# Patient Record
Sex: Male | Born: 1955 | Race: White | Hispanic: No | Smoking: Former smoker
Health system: Southern US, Community
[De-identification: ages and names within clinical notes are randomized; demographics above are authoritative.]

## PROBLEM LIST (undated history)

## (undated) DIAGNOSIS — E119 Type 2 diabetes mellitus without complications: Secondary | ICD-10-CM

## (undated) DIAGNOSIS — I10 Essential (primary) hypertension: Secondary | ICD-10-CM

## (undated) DIAGNOSIS — E785 Hyperlipidemia, unspecified: Secondary | ICD-10-CM

## (undated) DIAGNOSIS — E039 Hypothyroidism, unspecified: Secondary | ICD-10-CM

## (undated) HISTORY — DX: Hypothyroidism, unspecified: E03.9

## (undated) HISTORY — DX: Hyperlipidemia, unspecified: E78.5

## (undated) HISTORY — DX: Type 2 diabetes mellitus without complications: E11.9

## (undated) HISTORY — DX: Essential (primary) hypertension: I10

---

## 2004-08-01 ENCOUNTER — Ambulatory Visit (HOSPITAL_COMMUNITY): Admission: RE | Admit: 2004-08-01 | Discharge: 2004-08-01 | Payer: Self-pay | Admitting: Gastroenterology

## 2005-10-14 ENCOUNTER — Ambulatory Visit: Payer: Self-pay | Admitting: Urology

## 2006-06-24 ENCOUNTER — Emergency Department: Payer: Self-pay | Admitting: Emergency Medicine

## 2006-07-14 ENCOUNTER — Ambulatory Visit: Payer: Self-pay | Admitting: Family Medicine

## 2010-02-05 ENCOUNTER — Ambulatory Visit: Payer: Self-pay | Admitting: Family Medicine

## 2011-06-22 ENCOUNTER — Other Ambulatory Visit: Payer: Self-pay | Admitting: Gastroenterology

## 2013-03-16 ENCOUNTER — Ambulatory Visit: Payer: Self-pay | Admitting: Unknown Physician Specialty

## 2014-01-10 IMAGING — US ABDOMEN ULTRASOUND LIMITED
1 series · 13 of 25 positions shown · non-contrast
Comparison: none

REASON FOR EXAM: gallbladder pancreas RUQ pain
COMMENTS:

[Series 1: abdomen ultrasound limited · 0.28mm/px · 13 of 58 slices shown]
[im 1/58]
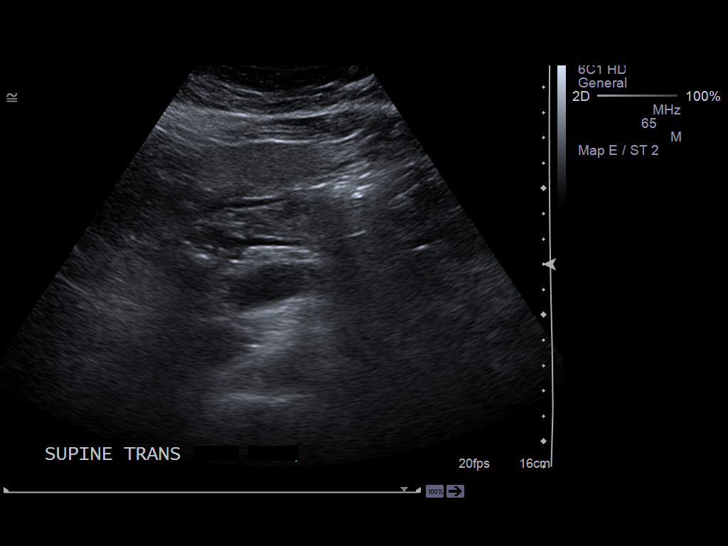
[im 5/58]
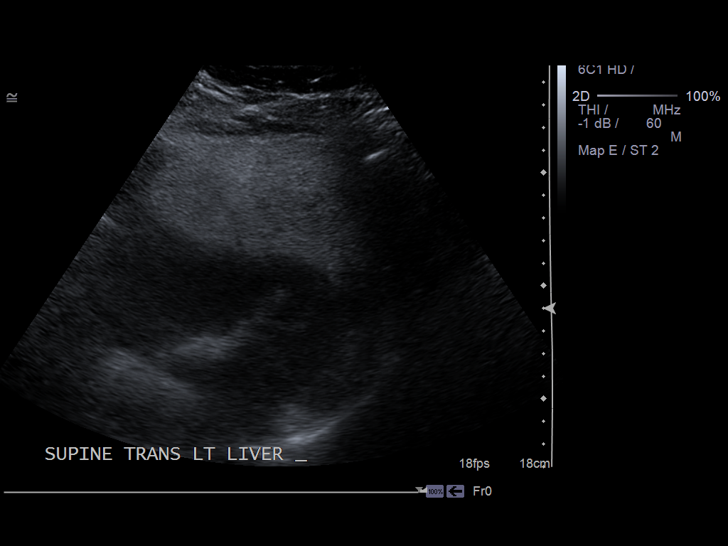
[im 10/58]
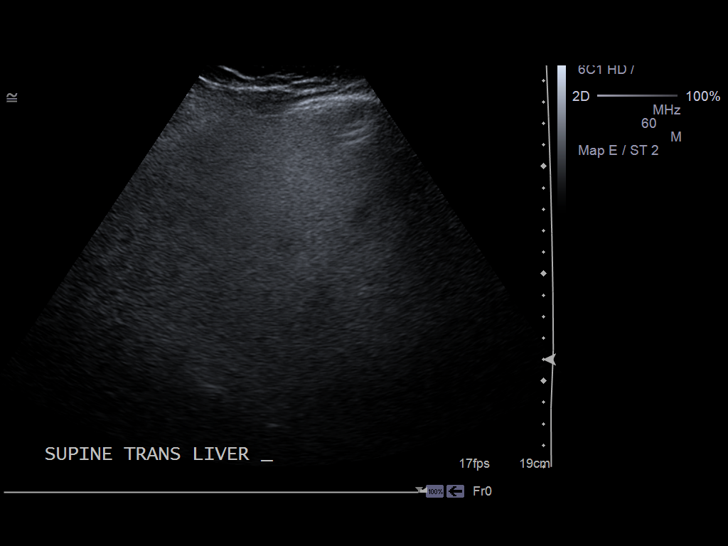
[im 15/58]
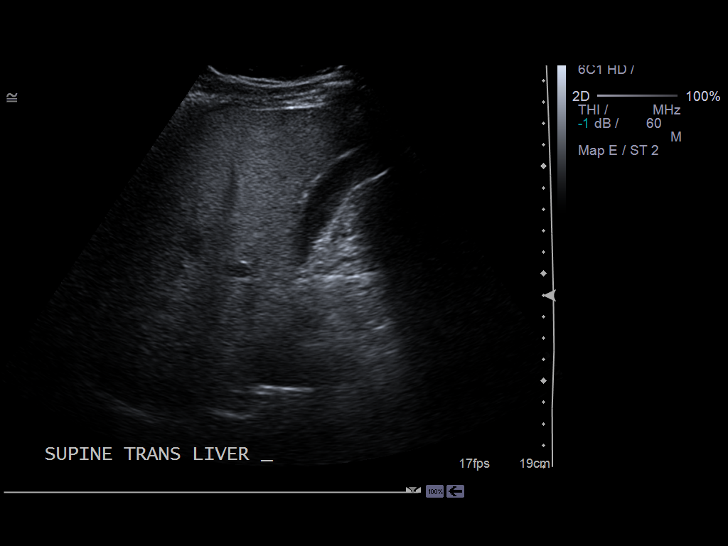
[im 20/58]
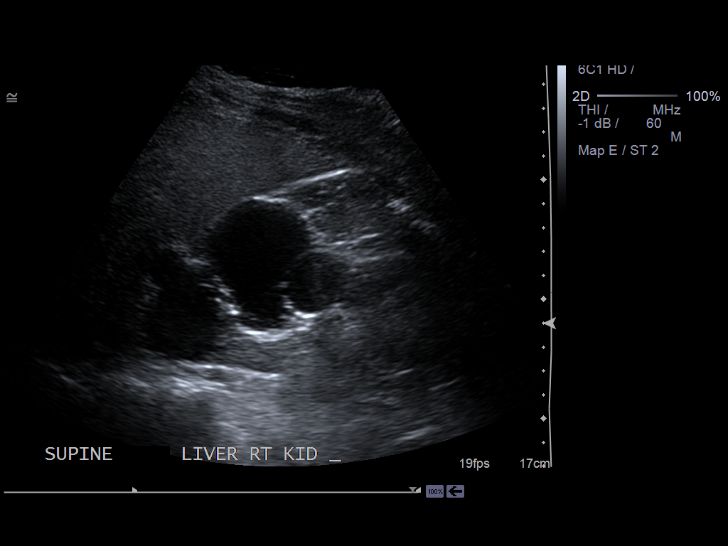
[im 24/58]
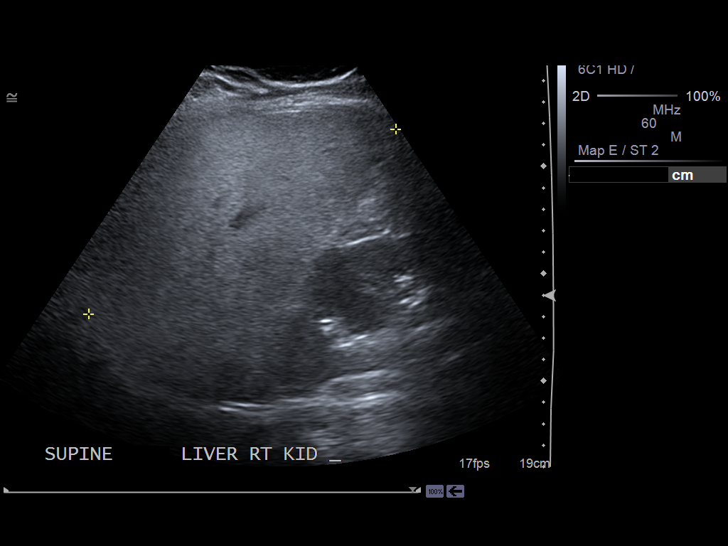
[im 29/58]
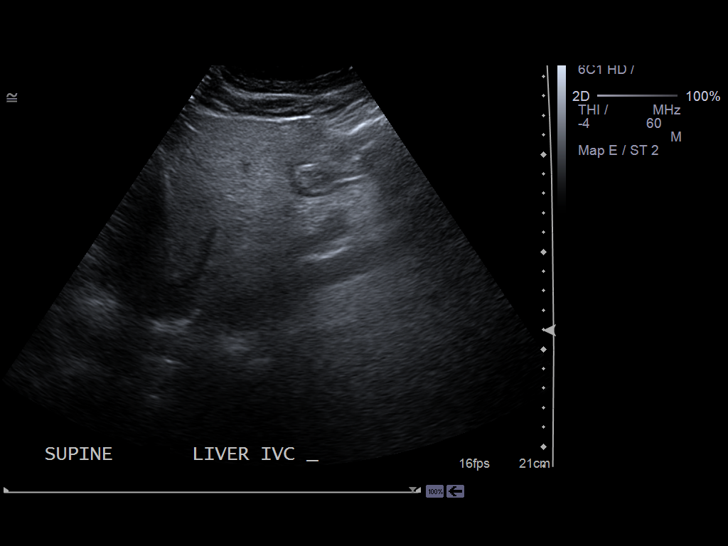
[im 34/58]
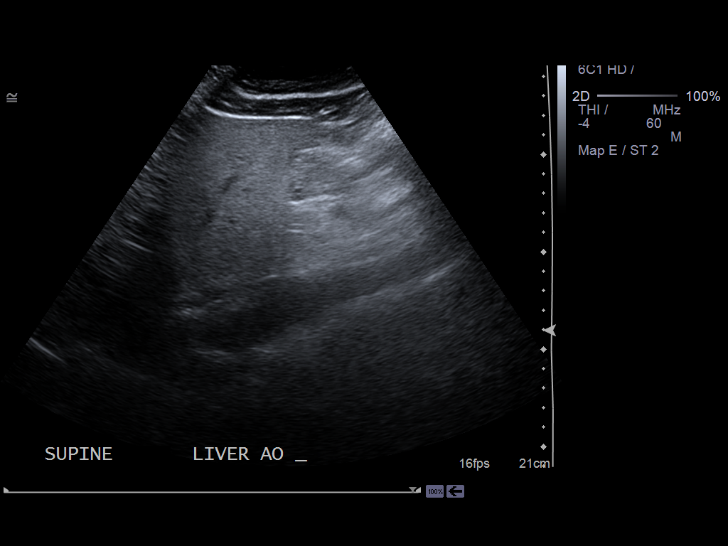
[im 39/58]
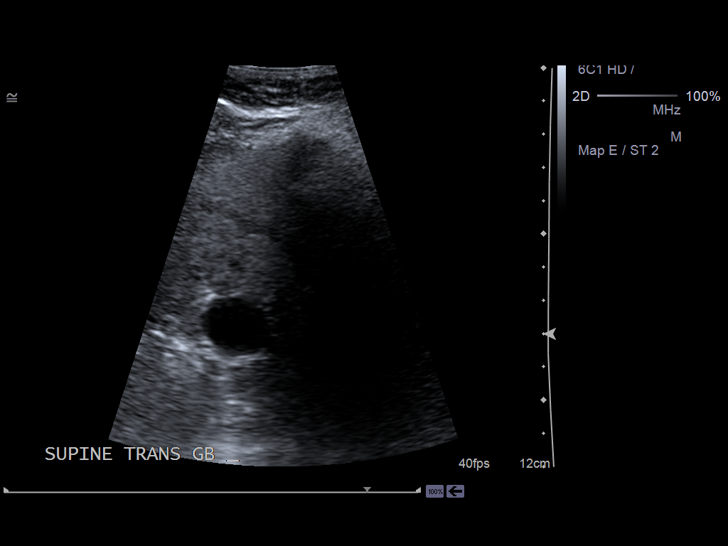
[im 43/58]
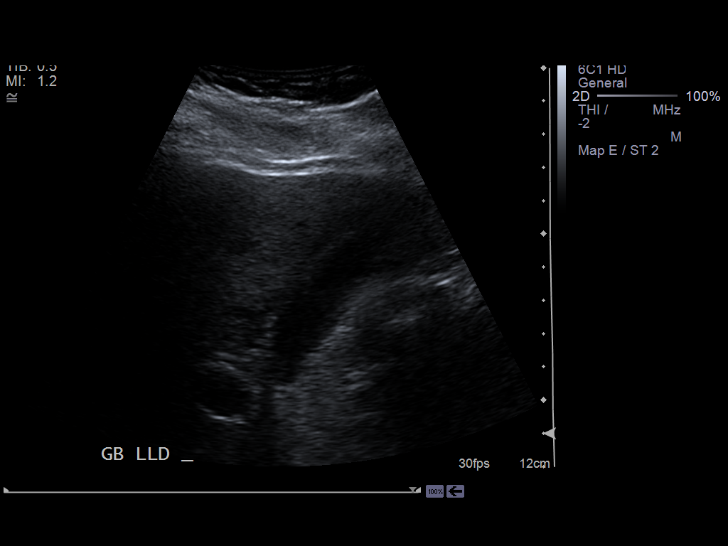
[im 48/58]
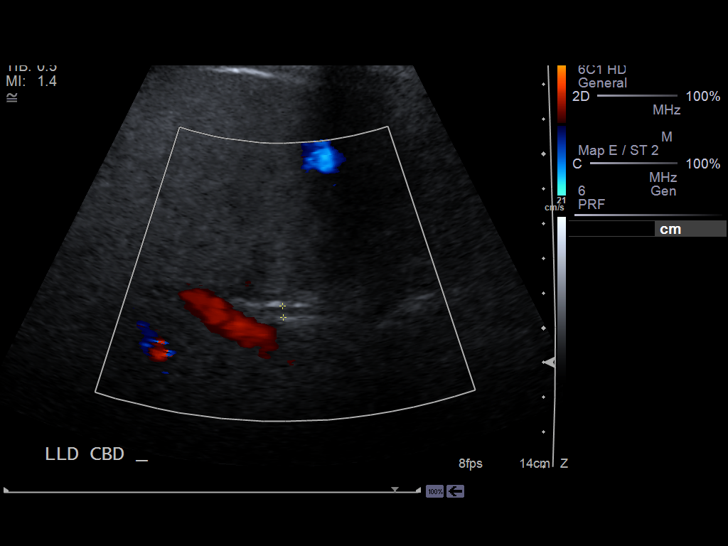
[im 53/58]
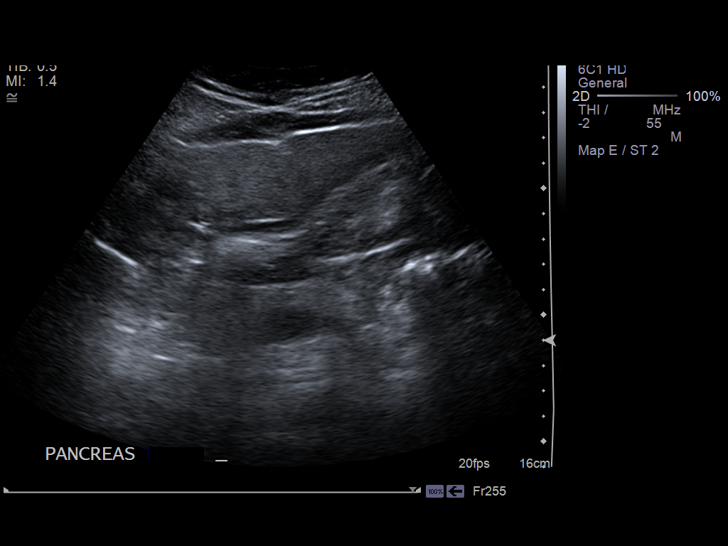
[im 58/58]
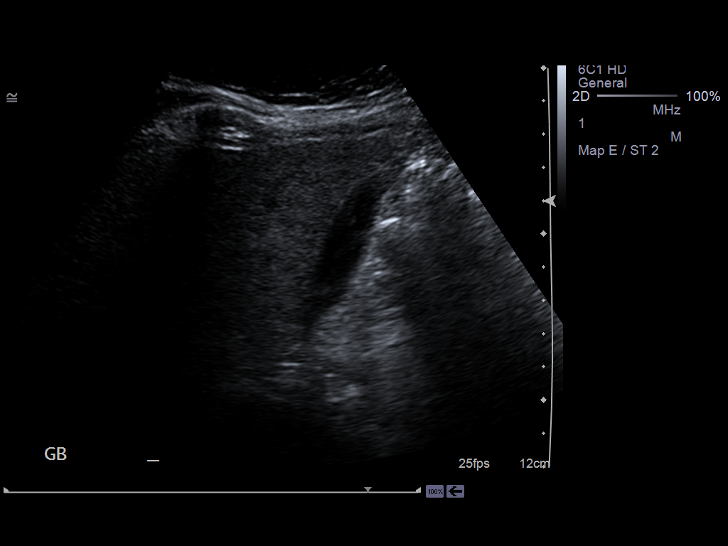

[13 of 25 positions shown; findings below may reference images not displayed]

PROCEDURE:     US  - US ABDOMEN LIMITED SURVEY  - March 16, 2013  [DATE]

RESULT:     A limited upper abdominal ultrasound examination was performed.

The liver exhibits increased echotexture consistent with fatty infiltration.
There is no focal mass or ductal dilation. Portal venous flow is normal in
direction toward the liver. Bowel gas limits evaluation of the pancreas in
that only the body could be demonstrated. No abnormality of the pancreatic
body is demonstrated.

The gallbladder is partially distended. There is no evidence of stones or
sludge. There is no positive sonographic Murphy's sign. There is no
gallbladder wall thickening or pericholecystic fluid. The common bile duct
measures 3.3 mm in diameter.

Incidental note is made of multiple septated cyst in the right kidney with
the largest measuring 6. 6 cm in greatest dimension.
IMPRESSION: 1. There are fatty infiltrative changes of the liver.
2. The gallbladder and common bile duct are normal in appearance.
3. The visualized portions of the pancreas appear normal.
4. There are multiple cysts associated with the right kidney.

[REDACTED]

## 2021-10-17 ENCOUNTER — Ambulatory Visit (INDEPENDENT_AMBULATORY_CARE_PROVIDER_SITE_OTHER): Payer: Self-pay | Admitting: Urology

## 2021-10-17 ENCOUNTER — Encounter: Payer: Self-pay | Admitting: Urology

## 2021-10-17 ENCOUNTER — Other Ambulatory Visit: Payer: Self-pay

## 2021-10-17 VITALS — BP 163/86 | HR 80 | Ht 71.0 in | Wt 185.0 lb

## 2021-10-17 DIAGNOSIS — N281 Cyst of kidney, acquired: Secondary | ICD-10-CM

## 2021-10-17 DIAGNOSIS — N2889 Other specified disorders of kidney and ureter: Secondary | ICD-10-CM

## 2021-10-17 LAB — URINALYSIS, COMPLETE
Bilirubin, UA: NEGATIVE
Glucose, UA: NEGATIVE
Ketones, UA: NEGATIVE
Leukocytes,UA: NEGATIVE
Nitrite, UA: NEGATIVE
Protein,UA: NEGATIVE
RBC, UA: NEGATIVE
Specific Gravity, UA: 1.025 (ref 1.005–1.030)
Urobilinogen, Ur: 0.2 mg/dL (ref 0.2–1.0)
pH, UA: 5 (ref 5.0–7.5)

## 2021-10-17 LAB — MICROSCOPIC EXAMINATION
Bacteria, UA: NONE SEEN
RBC, Urine: NONE SEEN /hpf (ref 0–2)

## 2021-10-19 ENCOUNTER — Encounter: Payer: Self-pay | Admitting: Urology

## 2021-10-19 NOTE — Progress Notes (Signed)
10/17/2021 10:35 AM   Jay Bennett Oct 29, 1955 [id number]  Referring provider: Sharlotte Alamo, DO [address] [place],  [place] [address]  Chief Complaint  Patient presents with   renal mass    HPI: Jay Bennett is a 67 y.o. male referred for renal mass evaluation.  Prior history of renal cysts  Renal ultrasound ordered by PCP and performed 09/19/2021 showed bilateral renal cyst including a 5.1 cm right renal cyst and 2.3 cm left renal cyst.  Some septated cysts were noted.  PVR on bladder survey images was 31 cc No flank, abdominal or pelvic pain No dysuria or gross hematuria Variable nocturia on tamsulosin which he stopped 1 week ago MRI 2007 showed bilateral simple renal cysts; RUS 01/2010 showed a 5.2 cm right renal cyst   PMH: Hypertension Hyperlipidemia Hypothyroidism Diabetes  Surgical History:  Basal cell carcinoma excision 03/2021   Biopsy of nodule on thyroid   Fine needle biopsy thyroid nodule   Mass removed in groin area-benign.   Neuroblastoma excision age 16 months   Thyroidectomy, partial 2019   Vein closure both legs   Home Medications:  Allergies as of 10/17/2021   No Known Allergies      Medication List        Accurate as of October 17, 2021 11:59 PM. If you have any questions, ask your nurse or doctor.          STOP taking these medications    amLODipine 5 MG tablet Commonly known as: NORVASC Stopped by: Abbie Sons, MD   aspirin 81 MG EC tablet Stopped by: Abbie Sons, MD   losartan 50 MG tablet Commonly known as: COZAAR Stopped by: Abbie Sons, MD       TAKE these medications    fluticasone 50 MCG/ACT nasal spray Commonly known as: FLONASE Place into the nose.   ketoconazole 2 % shampoo Commonly known as: NIZORAL Apply topically 3 (three) times a week.   levothyroxine 75 MCG tablet Commonly known as: SYNTHROID Take 75 mcg by mouth daily.   metFORMIN 500 MG tablet Commonly known as:  GLUCOPHAGE Take 500 mg by mouth every morning.   rosuvastatin 5 MG tablet Commonly known as: CRESTOR Take 5 mg by mouth at bedtime.   tamsulosin 0.4 MG Caps capsule Commonly known as: FLOMAX Take 0.4 mg by mouth at bedtime.   Thera Tabs Take 1 tablet by mouth daily.        Allergies: No Known Allergies  Family History: Family History  Problem Relation Age of Onset   Bladder Cancer Father     Social History:  reports that he has quit smoking. His smoking use included cigarettes. He does not have any smokeless tobacco history on file. No history on file for alcohol use and drug use.   Physical Exam: BP (!) 163/86    Pulse 80    Ht 5\' 11"  (1.803 m)    Wt 185 lb (83.9 kg)    BMI 25.80 kg/m   Constitutional:  Alert and oriented, No acute distress. HEENT: [place] AT, moist mucus membranes.  Trachea midline, no masses. Cardiovascular: No clubbing, cyanosis, or edema. Respiratory: Normal respiratory effort, no increased work of breathing.   Pertinent Imaging: Images were not available for review   Assessment & Plan:    1.  Bilateral renal cysts Septated cyst on recent ultrasound.  Since prior imaging noted simple cyst we will schedule renal mass protocol CT.  They are most likely  Bosniak 2 cyst and will not need follow-up  2. Prostate cancer screening PSA 09/10/2021 0.8   Abbie Sons, [place] [address], [place], [place] [address] [phone]

## 2021-10-27 ENCOUNTER — Telehealth: Payer: Self-pay | Admitting: Urology

## 2021-10-27 NOTE — Telephone Encounter (Signed)
Called patient and advised him OK to proceed with CT scan per Dr. Bernardo Heater. ?

## 2021-10-29 ENCOUNTER — Other Ambulatory Visit: Payer: Self-pay

## 2021-10-29 ENCOUNTER — Ambulatory Visit
Admission: RE | Admit: 2021-10-29 | Discharge: 2021-10-29 | Disposition: A | Discharge status: Home / Self Care | Payer: Medicare Other | Source: Ambulatory Visit | Attending: Urology | Admitting: Urology

## 2021-10-29 DIAGNOSIS — N281 Cyst of kidney, acquired: Secondary | ICD-10-CM | POA: Insufficient documentation

## 2021-10-29 LAB — POCT I-STAT CREATININE: Creatinine, Ser: 1.2 mg/dL (ref 0.61–1.24)

## 2021-10-29 MED ORDER — IOHEXOL 300 MG/ML  SOLN
100.0000 mL | Freq: Once | INTRAMUSCULAR | Status: AC | PRN
  Administered 2021-10-29: 100 mL via INTRAVENOUS

## 2021-10-31 ENCOUNTER — Telehealth: Payer: Self-pay | Admitting: *Deleted

## 2021-10-31 NOTE — Telephone Encounter (Signed)
Notified patient as instructed, patient pleased °

## 2021-10-31 NOTE — Telephone Encounter (Signed)
-----   Message from Riki Altes, MD sent at 10/31/2021  3:53 PM EST ----- ?CT shows that the renal cysts CT reviewed and based on the radiologic classification of renal cyst they are benign and no further routine imaging is needed.  Please let me know if he has any questions. ?

## 2022-08-25 IMAGING — CT CT ABDOMEN WO/W CM
3 of 13 series · 10 of 46 positions shown, 16 images · IV contrast (agent unspecified)
Comparison: MRI abdomen 07/14/2006

CLINICAL DATA: Renal cysts

EXAM:
CT ABDOMEN WITHOUT AND WITH CONTRAST
TECHNIQUE: Multidetector CT imaging of the abdomen was performed following the
standard protocol before and following the bolus administration of
intravenous contrast.

[Series 2: axial without renal without 2.00 · axial · non-contrast · 0.69mm/px · z∈[-1321,-1171]mm · 4 of 126 slices shown, 9 images]
[im 26/126  soft-tissue]
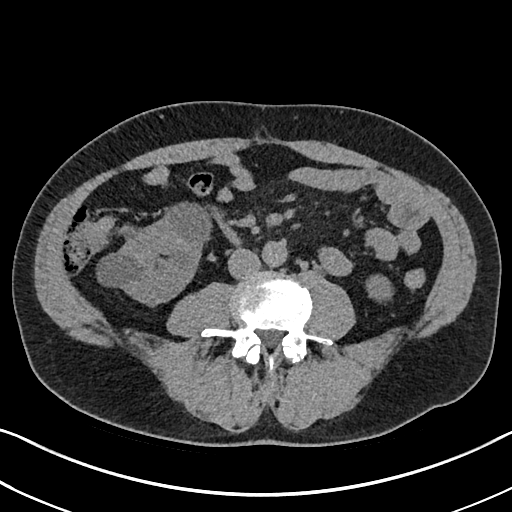
[im 26/126  lung]
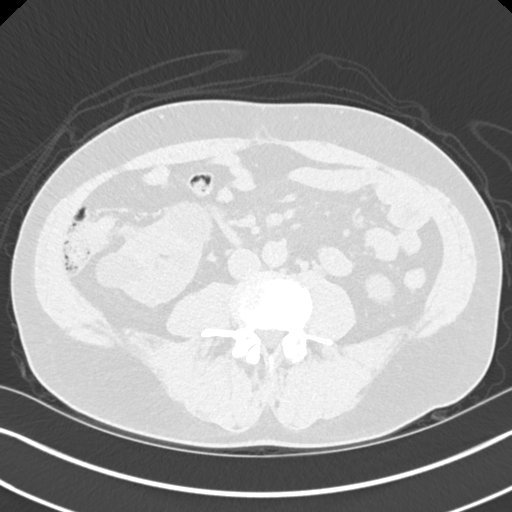
[im 26/126  bone]
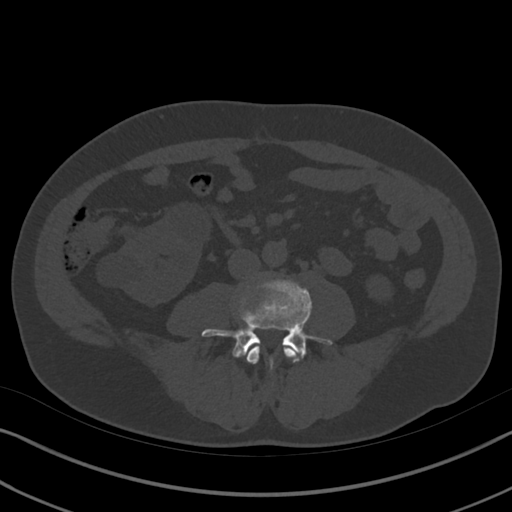
[im 51/126  soft-tissue]
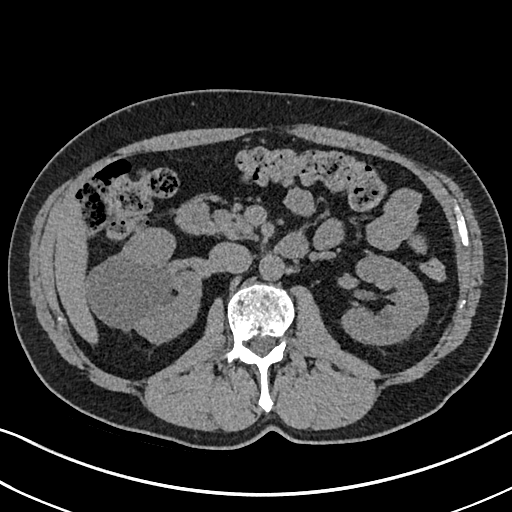
[im 51/126  lung]
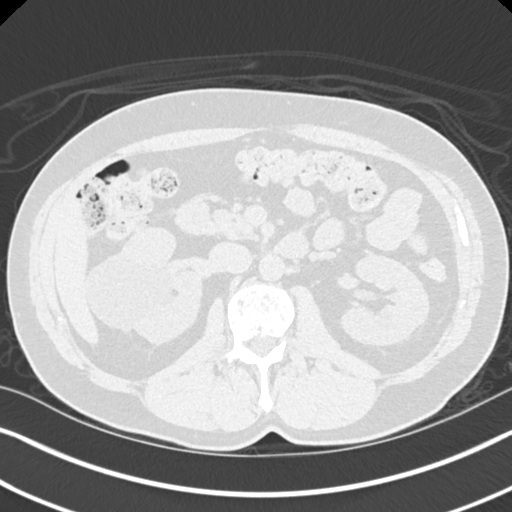
[im 76/126  soft-tissue]
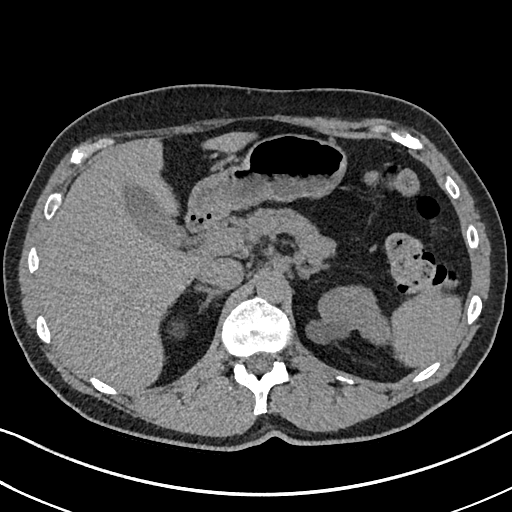
[im 76/126  lung]
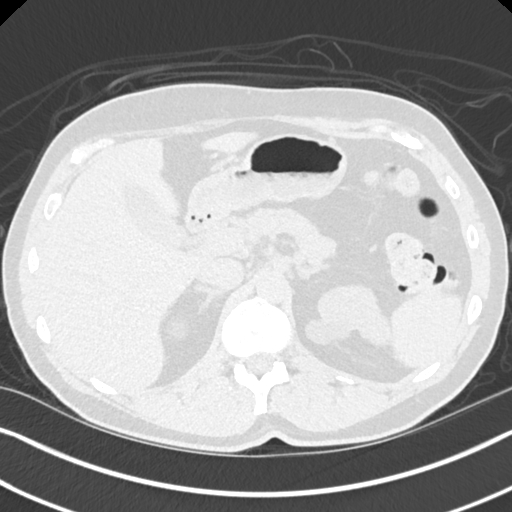
[im 101/126  soft-tissue]
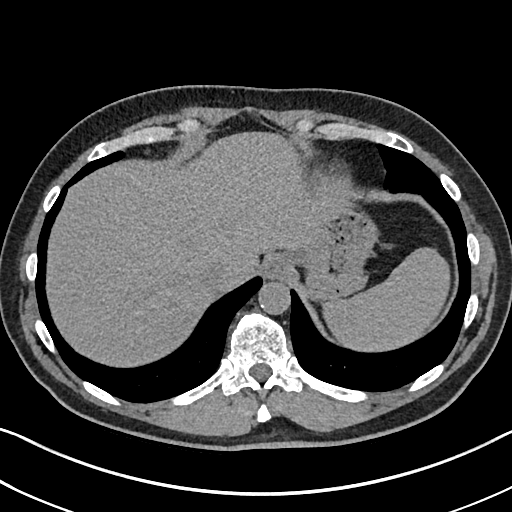
[im 101/126  lung]
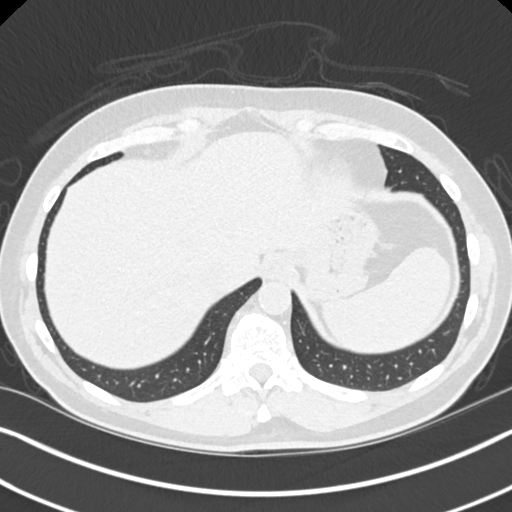

[Series 4: coronal without renal without 2.00 cor · coronal · non-contrast · 0.49mm/px · 2 of 128 slices shown, 3 images]
[im 43/128  soft-tissue]
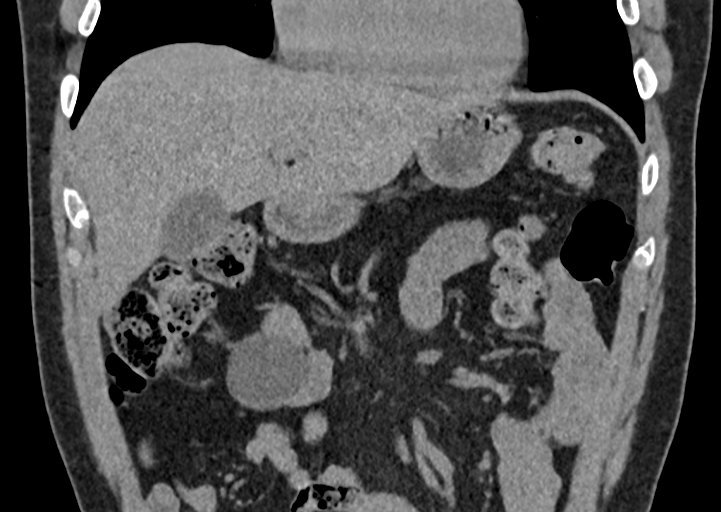
[im 43/128  bone]
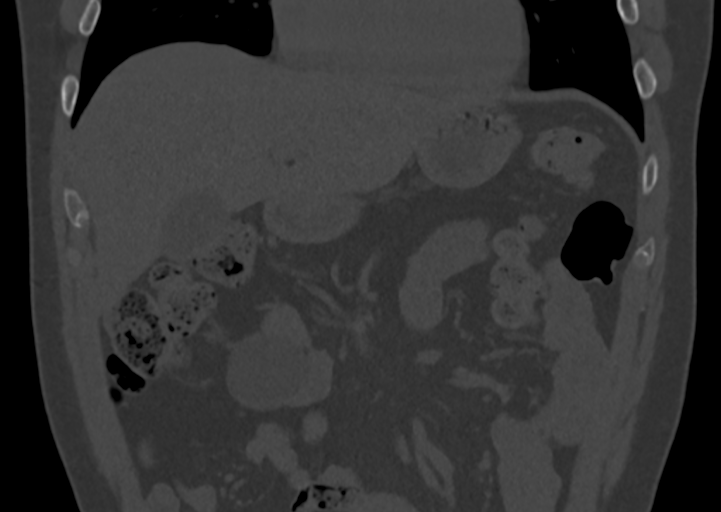
[im 85/128  soft-tissue]
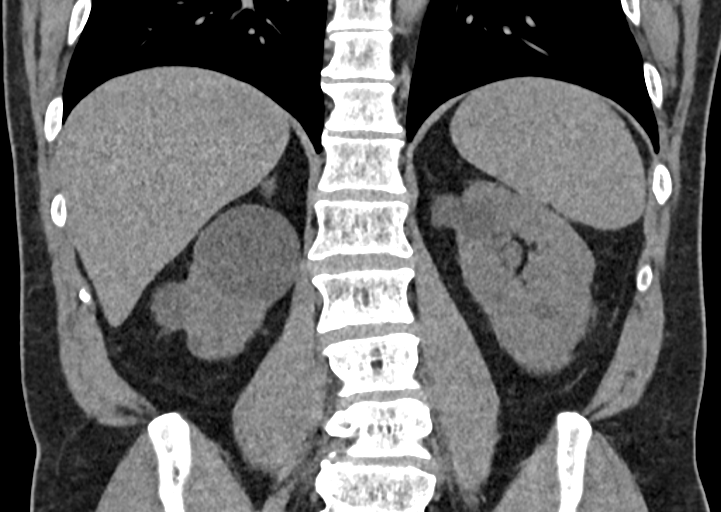

[Series 8: axial arterial renal arterial 2.00 · axial · arterial · 0.69mm/px · z∈[-1321,-1171]mm · 4 of 126 slices shown]
[im 26/126  soft-tissue]
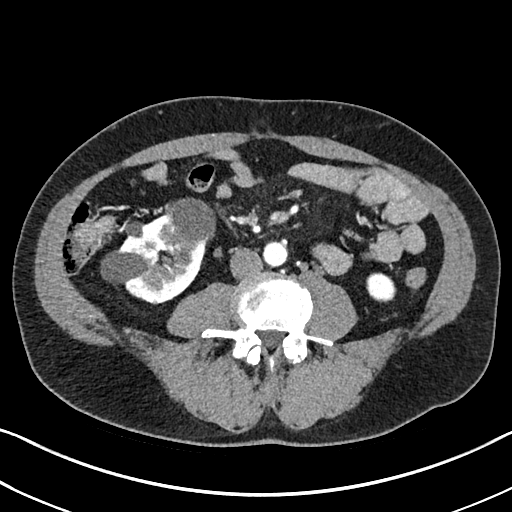
[im 51/126  soft-tissue]
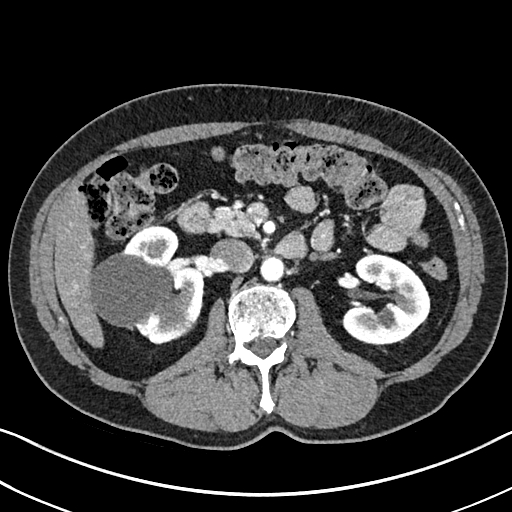
[im 76/126  soft-tissue]
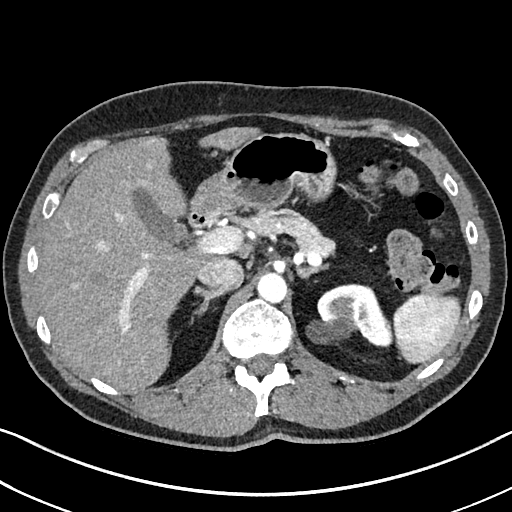
[im 101/126  soft-tissue]
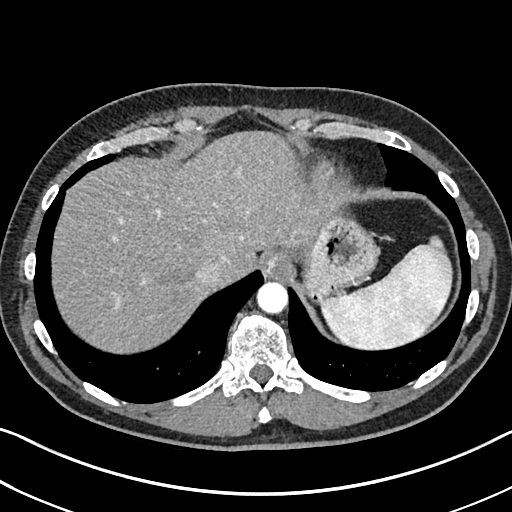

[10 of 46 positions shown; findings below may reference images not displayed]

RADIATION DOSE REDUCTION: This exam was performed according to the
departmental dose-optimization program which includes automated
exposure control, adjustment of the mA and/or kV according to
patient size and/or use of iterative reconstruction technique.

CONTRAST:  100mL OMNIPAQUE IOHEXOL 300 MG/ML  SOLN
FINDINGS: Lower chest: No acute abnormality.

Hepatobiliary: Liver is normal in size and contour. 1 cm hypodense
likely cyst of the dome of the right hepatic lobe. Tiny hypodensity
in the left hepatic lobe is too small to characterize, most likely a
cyst or hemangioma. Gallbladder appears normal. No biliary ductal
dilatation.

Pancreas: Unremarkable. No pancreatic ductal dilatation or
surrounding inflammatory changes.

Spleen: Normal in size without focal abnormality.

Adrenals/Urinary Tract: Adrenal glands appear normal. 3 mm
nonobstructing calculus in the lower pole left kidney. Multiple
renal cortical cysts identified bilaterally. 1 cm exophytic
increased density cyst at the lower pole right kidney likely a
hemorrhagic cyst. Several additional cysts demonstrate thin internal
septations, largest in the right kidney measures 6 x 4.8 cm in the
midpole and a 4.7 x 4.7 cm cyst in the anterior right kidney
demonstrates a thin partially calcified septation. No definitely
enhancing renal mass identified bilaterally. No hydronephrosis.

Stomach/Bowel: Visualized bowel is unremarkable.

Vascular/Lymphatic: Aortic atherosclerosis. No enlarged abdominal
lymph nodes.

Other: No ascites.

Musculoskeletal: No suspicious bony lesions identified.
IMPRESSION: Multiple simple and mildly complex renal cortical cysts bilaterally
right greater than left, Bosniak 1 and Bosniak 2. The cysts are
increased in size and number since [DATE].
